# Patient Record
Sex: Male | Born: 1960 | Hispanic: No | Marital: Married | Smoking: Never smoker
Health system: Southern US, Community
[De-identification: ages and names within clinical notes are randomized; demographics above are authoritative.]

## PROBLEM LIST (undated history)

## (undated) HISTORY — PX: FOOT SURGERY: SHX648

## (undated) HISTORY — PX: ANKLE ARTHROPLASTY: SUR68

---

## 2001-04-06 ENCOUNTER — Emergency Department (HOSPITAL_COMMUNITY): Admission: EM | Admit: 2001-04-06 | Discharge: 2001-04-06 | Payer: Self-pay | Admitting: Emergency Medicine

## 2001-04-06 ENCOUNTER — Encounter: Payer: Self-pay | Admitting: Emergency Medicine

## 2002-02-07 ENCOUNTER — Emergency Department (HOSPITAL_COMMUNITY): Admission: EM | Admit: 2002-02-07 | Discharge: 2002-02-07 | Payer: Self-pay | Admitting: Emergency Medicine

## 2005-07-04 ENCOUNTER — Emergency Department (HOSPITAL_COMMUNITY): Admission: EM | Admit: 2005-07-04 | Discharge: 2005-07-04 | Payer: Self-pay | Admitting: Emergency Medicine

## 2005-12-20 ENCOUNTER — Emergency Department (HOSPITAL_COMMUNITY): Admission: EM | Admit: 2005-12-20 | Discharge: 2005-12-20 | Payer: Self-pay | Admitting: Emergency Medicine

## 2005-12-25 ENCOUNTER — Emergency Department (HOSPITAL_COMMUNITY): Admission: EM | Admit: 2005-12-25 | Discharge: 2005-12-25 | Payer: Self-pay | Admitting: Emergency Medicine

## 2005-12-30 ENCOUNTER — Emergency Department (HOSPITAL_COMMUNITY): Admission: EM | Admit: 2005-12-30 | Discharge: 2005-12-30 | Payer: Self-pay | Admitting: Emergency Medicine

## 2006-02-08 ENCOUNTER — Inpatient Hospital Stay (HOSPITAL_COMMUNITY): Admission: EM | Admit: 2006-02-08 | Discharge: 2006-02-11 | Payer: Self-pay | Admitting: Emergency Medicine

## 2006-02-18 ENCOUNTER — Encounter: Payer: Self-pay | Admitting: Vascular Surgery

## 2006-02-18 ENCOUNTER — Emergency Department (HOSPITAL_COMMUNITY): Admission: EM | Admit: 2006-02-18 | Discharge: 2006-02-18 | Payer: Self-pay | Admitting: Emergency Medicine

## 2008-07-19 ENCOUNTER — Encounter: Admission: RE | Admit: 2008-07-19 | Discharge: 2008-07-19 | Payer: Self-pay | Admitting: Chiropractic Medicine

## 2010-11-08 NOTE — Op Note (Signed)
NAMEBELMONT, VALLI        ACCOUNT NO.:  [id number]   MEDICAL RECORD NO.:  [id number]          PATIENT TYPE:  INP   LOCATION:  5019                         FACILITY:  [facility]   PHYSICIAN:  Doralee Albino. Carola Frost, M.D. DATE OF BIRTH:  Nov 01, 1960   DATE OF PROCEDURE:  02/08/2006  DATE OF DISCHARGE:                                 OPERATIVE REPORT   PREOPERATIVE DIAGNOSES:  1. Right subtalar fracture-dislocation involving the navicular and      cuneiforms.  2. Right distal radius fracture.   POSTOPERATIVE DIAGNOSES:  1. Right subtalar fracture-dislocation involving the navicular and      cuneiforms.  2. Right distal radius fracture.   PROCEDURES:  1. Open reduction of subtalar dislocation.  2. ORIF of navicular.  3. Open treatment of a distal radius.   SURGEON:  Dr. Myrene Galas.   ASSISTANT:  None.   ANESTHESIA:  General.   COMPLICATIONS:  None.   SPECIMENS:  None.   ESTIMATED BLOOD LOSS:  150 mL.   DISPOSITION:  PACU.   CONDITION:  Stable.   BRIEF SUMMARY OF INDICATIONS FOR PROCEDURE:  Ruby Dilone is a 50-  year-old male who fell 30 feet off a ladder when some hornets attacked him.  He denies any loss of consciousness, denies tingling, numbness in his  extremities.  X-rays and CT were notable for L3 compression fracture with a  little less than 50% of loss of height, no retropulsion, no significant  malalignment, a comminuted subtalar fracture-dislocation and with fracture  of the navicular, as well as the intermediate and lateral cuneiforms and  cuboid.  there did not appear to be any step-off at the second metatarsal  cuneiform joint, suggestive of full Lisfranc injury.  There did appear to be  impaction of the talar head into the navicular.  We discussed the risks and  benefits of surgery, including the possibility of wound breakdown,  infection, nerve injury, vessel injury, arthritis, stiffness, need for  further surgery including fusion or hardware  removal, as well as  thromboembolic complications.  With regard the wrist, we did also mention  the possibility of ligamentous injury involving the carpals, and whether or  not delayed reconstruction may be indicated.  These films were also reviewed  with hand surgeon, Dr. Dairl Ponder, who recommended proceeding with  internal fixation of the bone, with or without pinning as indicated during  fluoro examination, as well as manipulation during the procedure.  After  full discussion, the patient and his wife wished to proceed.   BRIEF DESCRIPTION OF PROCEDURE:  Mr. Velez was administered  preoperative antibiotics, taken to the operating room where general  anesthesia was induced.  His right upper extremity and right lower extremity  were prepped and draped in the usual sterile fashion.  We began with the  right lower extremity.  Using a 3.5 cm incision medial to the anterior  tibial tendon, we carried dissection carefully down, retracting the  neurovascular bundle medially, and incised the capsule.  There was  significant capsular injury and disruption, and much of this was caught  within the dislocation site.  We were unable to mobilize  and reduce the  fracture initially, and had to debride much of this capsular material.  We  were eventually able to disimpact the talar head and to obtain a reduction.  We then used a pointed tenaculum to compress the fracture site of the  navicular.  We still were unable to completely adequately reduce the  calcaneocuboid joint, and consequently identified the joint on fluoro, and  made a separate lateral incision, retracting the EDB anteriorly and exposing  the joint.  We then were able to manipulate it with the Glorious Peach and remove  small blocking fragments, and to obtain an anatomic reduction under direct  visualization.  This was held with a 1.6 K-wire, which was bent outside the  skin and Jurgan ball placed.  Two dorsal to plantar lag screws  were placed  using 2-7 Synthes screws from the foot modular set in order to obtain  compression across the navicular fracture site.  This resulted in a stable  position for the midfoot and at that point wounds were irrigated and closed  in standard layered fashion, using 2-0 Vicryl and 3-0 nylon.  Sterile gently  compressive dressing was applied and a posterior and stirrup splint with  foot in slight eversion.   Attention was then turned to the right distal radius where a small stab  incision was made over the radial styloid, and the guide pin for the Acutrak  screw placed in the center of the styloid segment.  It was then driven  across under the subchondral surface in order to obtain compression across  the articular surface.  We did do this, but it did seem to translate the  styloid segment somewhat and consequently the screw was redirected slightly  more proximal in the ulnar column of the distal radius, and this resulted in  an improved alignment.  Multiple fluoro images were obtained, demonstrating  acceptable reduction of the articular surface and compression without step-  off, but with a small dorsal segment being visualized as it attached it to  the joint capsule.  The scapholunate appeared to be reduced in a relaxed  position for the wrist, and consequently no additional fixation was placed  between the carpals.  We will, again, watch this closely in the  postoperative period to make sure that no further intervention is needed.  This will most likely consist of clenched views some time after a union of  his distal radius fracture, most likely at the 6-week mark.  This appears to  be a partial injury, which is consistent with the expected pattern as  described by Dr. Mina Marble preoperatively.  Again, sterile gently compressive  dressing was applied and a sugar-tong splint.  The patient was then awakened from anesthesia and transported to the PACU in stable condition.    PROGNOSIS:  Mr. Butler Denmark sustained severe high-energy, fracture-  dislocation to his right foot, and he is at significantly elevated risk of  complications, including midfoot arthritis, stiffness, loss of motion and  possible need for further surgery, including possible fusion.  He also is at  increased risk of thromboembolic complications given the anticipated  difficulty with mobilization for ipsilateral upper and lower extremity  fractures.  He will be on Lovenox initially, and we will see if we can  arrange for qualification to the Lovenox indigent program.      Doralee Albino. Carola Frost, M.D.  Electronically Signed     MHH/MEDQ  D:  02/08/2006  T:  02/08/2006  Job:  [id number]

## 2010-11-08 NOTE — Discharge Summary (Signed)
Zachary Curtis, Zachary Curtis        ACCOUNT NO.:  [id number]   MEDICAL RECORD NO.:  [id number]          PATIENT TYPE:  INP   LOCATION:  5019                         FACILITY:  [facility]   PHYSICIAN:  Doralee Albino. Carola Frost, M.D. DATE OF BIRTH:  [birth year]   DATE OF ADMISSION:  02/08/2006  DATE OF DISCHARGE:  02/11/2006                                 DISCHARGE SUMMARY   ADMITTING DIAGNOSES:  1. L3 compression fracture.  2. Right distal radius fracture.  3. Navicular fracture.  4. Subtalar dislocation on the right foot.   DISCHARGE DIAGNOSES:  1. L3 compression fracture.  2. Right distal radius fracture.  3. Navicular fracture.  4. Subtalar dislocation on the right foot.   BRIEF HISTORY:  Patient is a 50 year old muscular male who fell 30 feet off  the ladder when he was attacked by hornets.  He had his chainsaw in his  hands.  He complained of right wrist, right foot and back pain.  He denied  loss of consciousness, numbness and tingling in upper or lower extremities.  He was brought to the emergency room and found to have an L3 compression  fracture, right subtalar dislocation and navicular cuboid fracture and right  distal radius fracture.  Treatment and options were explained to the patient  and he elected to proceed with surgical intervention.   CONSULTATIONS:  None.   HOSPITAL COURSE:  The patient was admitted through the emergency room with  the above-stated injuries.  Plans were made to take him to the operating  room for surgical intervention.  On February 08, 2006 he underwent an open  reduction internal fixation of subclavian dislocation, ORIF navicular and  open treatment of distal radius fracture.  Post treatment with an LSO for  his L3 compression fracture.  First day postoperatively, patient was doing  well.  Vital signs were stable.  He was neurovascularly intact throughout.  Stable short leg splint on the right lower extremity and stable in the right  wrist, short arm  splint.  LSO was fitted to the patient in the morning and  arrangements were made for discharge planning.  He was placed on Lovenox for  DVT prophylaxis.  He had some acute blood loss anemia postoperatively and it  was stable, not requiring transfusion.  Weaned off PCA and p.o. pain meds.  Okay to sit up in his LSO.  Ready for discharge home after he progressed  with therapy on February 11, 2006.  He remained neurovascularly intact  throughout his hospital stay in bilateral upper and lower extremities.  His  postoperative splints were stable, clean and dry.  Stable in LSO for his  compression fracture.  Ready for discharge home.  Lovenox and Percocet.  He  is given Lovenox teaching.  Follow up with Dr. Carola Frost in 10 to 14 days.  Call  for problems.  Call if medical equipment is needed.  Nonweight bearing right  lower extremity and right wrist.  Weight bearing as tolerated through his  elbow on the right.  Okay to sit up with his LSO in place.   DISCHARGE CONDITION:  Stable and improved.   DISCHARGE LABS:  WBC 5.7, hemoglobin 12.8, hematocrit 36.7, PT 13.2, PTT  29.0, sodium 139, potassium 3.9.  All other indices are within normal  limits, can be obtained per hospital record.      Zachary Curtis.      Doralee Albino. Carola Frost, M.D.  Electronically Signed    SCI/MEDQ  D:  04/08/2006  T:  04/08/2006  Job:  [id number]

## 2014-09-26 ENCOUNTER — Emergency Department (HOSPITAL_COMMUNITY)
Admission: EM | Admit: 2014-09-26 | Discharge: 2014-09-26 | Disposition: A | Discharge status: Home / Self Care | Payer: Self-pay | Attending: Emergency Medicine | Admitting: Emergency Medicine

## 2014-09-26 ENCOUNTER — Encounter (HOSPITAL_COMMUNITY): Payer: Self-pay | Admitting: Emergency Medicine

## 2014-09-26 ENCOUNTER — Emergency Department (HOSPITAL_COMMUNITY): Payer: Self-pay

## 2014-09-26 DIAGNOSIS — M79672 Pain in left foot: Secondary | ICD-10-CM | POA: Insufficient documentation

## 2014-09-26 MED ORDER — NAPROXEN 250 MG PO TABS
500.0000 mg | ORAL_TABLET | Freq: Once | ORAL | Status: AC
  Administered 2014-09-26: 500 mg via ORAL
  Filled 2014-09-26: qty 2

## 2014-09-26 MED ORDER — NAPROXEN 500 MG PO TABS: 500.0000 mg | ORAL_TABLET | Freq: Two times a day (BID) | ORAL | Status: DC

## 2014-09-26 NOTE — Discharge Instructions (Signed)
Take naproxen as prescribed. Follow up with orthopedics.  Musculoskel Management consultantDolor msculoesqueltico (Musculoskeletal Pain) El dolor musculoesqueltico se siente en huesos y msculos. El dolor puede ocurrir en cualquier parte del cuerpo. El profesional que lo asiste podr tratarlo sin Geologist, engineeringconocer la causa del dolor. Lo tratar Time Warneraunque las pruebas de laboratorio (sangre y [place]), las radiografas y otros estudios sean normales. La causa de estos dolores puede ser un virus.  CAUSAS Generalmente no existe una causa definida para este trastorno. Tambin el Citigroupmalestar puede deberse a la Kingstonactividad excesiva. En la actividad excesiva se incluye el hacer ejercicios fsicos muy intensos cuando no se est en buena forma. El dolor de huesos tambin puede deberse a cambios climticos. Los huesos son sensibles a los cambios en la presin atmosfrica. INSTRUCCIONES PARA EL CUIDADO DOMICILIARIO  Para proteger su privacidad, no se entregarn los The Sherwin-Williamsresultados de las pruebas por telfono. Asegrese de conseguirlos. Consulte el modo en que podr obtenerlos si no se lo han informado. Es su responsabilidad contar con los Lubrizol Corporationresultados de las pruebas.  Utilice los medicamentos de venta libre o de prescripcin para Chief Technology Officerel dolor, Environmental health practitionerel malestar o la [place] Springsfiebre, segn se lo indique el profesional que lo asiste. Si le han administrado medicamentos, no conduzca, no opere maquinarias ni Diplomatic Services operational officerherramientas elctricas, y tampoco firme documentos legales durante 24 horas. No beba alcohol. No tome pldoras para dormir ni otros medicamentos que Museum/gallery curatorpuedan interferir en el tratamiento.  Podr seguir con todas las actividades a menos que stas le ocasionen ms Merck & Codolor. Cuando el dolor disminuya, es importante que gradualmente reanude toda la rutina habitual. Retome las actividades comenzando lentamente. Aumente gradualmente la intensidad y la duracin de sus actividades o del ejercicio.  Durante los perodos de dolor intenso, el reposo en cama puede ser beneficioso.  Recustese o sintese en la posicin que le sea ms cmoda.  Coloque hielo sobre la zona afectada.  Ponga hielo en Lucile Shuttersuna bolsa.  Colquese una toalla entre la piel y la bolsa de hielo.  Aplique el hielo durante 10 a 20 minutos 3  4 veces por da.  Si el dolor empeora, o no desaparece puede ser Northeast Utilitiesnecesario repetir las pruebas o Education officer, environmentalrealizar nuevos exmenes. El profesional que lo asiste podr requerir investigar ms profundamente para Veterinary surgeonencontrar la causa posible. SOLICITE ATENCIN MDICA DE INMEDIATO SI:  Siente que el dolor empeora y no se alivia con los medicamentos.  Siente dolor en el pecho asociado a falta de aire, sudoracin, nuseas o vmitos.  El dolor se localiza en el abdomen.  Comienza a sentir nuevos sntomas que parecen ser diferentes o que lo preocupan. ASEGRESE DE QUE:   Comprende las instrucciones para el alta mdica.  Controlar su enfermedad.  Solicitar atencin mdica de inmediato segn las indicaciones. Document Released: 03/19/2005 Document Revised: 09/01/2011 [facility] Patient Information 2015 [place], [place]. This information is not intended to replace advice given to you by your health care provider. Make sure you discuss any questions you have with your health care provider. etal Pain Musculoskeletal pain is muscle and boney aches and pains. These pains can occur in any part of the body. Your caregiver may treat you without knowing the cause of the pain. They may treat you if blood or urine tests, X-rays, and other tests were normal.  CAUSES There is often not a definite cause or reason for these pains. These pains may be caused by a type of germ (virus). The discomfort may also come from overuse. Overuse includes working out too hard when your body is not  fit. Boney aches also come from weather changes. Bone is sensitive to atmospheric pressure changes. HOME CARE INSTRUCTIONS   Ask when your test results will be ready. Make sure you get your test results.  Only  take over-the-counter or prescription medicines for pain, discomfort, or fever as directed by your caregiver. If you were given medications for your condition, do not drive, operate machinery or power tools, or sign legal documents for 24 hours. Do not drink alcohol. Do not take sleeping pills or other medications that may interfere with treatment.  Continue all activities unless the activities cause more pain. When the pain lessens, slowly resume normal activities. Gradually increase the intensity and duration of the activities or exercise.  During periods of severe pain, bed rest may be helpful. Lay or sit in any position that is comfortable.  Putting ice on the injured area.  Put ice in a bag.  Place a towel between your skin and the bag.  Leave the ice on for 15 to 20 minutes, 3 to 4 times a day.  Follow up with your caregiver for continued problems and no reason can be found for the pain. If the pain becomes worse or does not go away, it may be necessary to repeat tests or do additional testing. Your caregiver may need to look further for a possible cause. SEEK IMMEDIATE MEDICAL CARE IF:  You have pain that is getting worse and is not relieved by medications.  You develop chest pain that is associated with shortness or breath, sweating, feeling sick to your stomach (nauseous), or throw up (vomit).  Your pain becomes localized to the abdomen.  You develop any new symptoms that seem different or that concern you. MAKE SURE YOU:   Understand these instructions.  Will watch your condition.  Will get help right away if you are not doing well or get worse. Document Released: 06/09/2005 Document Revised: 09/01/2011 Document Reviewed: 02/11/2013 [facility] Patient Information 2015 [place], [place]. This information is not intended to replace advice given to you by your health care provider. Make sure you discuss any questions you have with your health care provider.

## 2014-09-26 NOTE — ED Provider Notes (Signed)
CSN: [id number]     Arrival date & time 09/26/14  1113 History  This chart was scribed for non-physician practitioner, Celene Skeen, PA-C working with Layla Maw Ward, DO by Greggory Stallion, ED scribe. This patient was seen in room TR08C/TR08C and the patient's care was started at 11:54 AM.   Chief Complaint  Patient presents with  . Foot Pain   The history is provided by the patient. No language interpreter was used.    HPI Comments: Zachary Curtis is a 54 y.o. male who presents to the Emergency Department complaining of worsening right foot pain that started 2 days ago. Pt has had a rod placed in his foot in 2007 due to injury. He denies new injury. Bearing weight worsens pain. Pt has taken ibuprofen with some relief. His wife states he has had intermittent pain since the surgery and states that his foot with intermittently have a blue tint with pain over the past few years.  History reviewed. No pertinent past medical history. History reviewed. No pertinent past surgical history. History reviewed. No pertinent family history. History  Substance Use Topics  . Smoking status: Never Smoker   . Smokeless tobacco: Not on file  . Alcohol Use: No    Review of Systems  Musculoskeletal: Positive for arthralgias.  All other systems reviewed and are negative.  Allergies  Review of patient's allergies indicates no known allergies.  Home Medications   Prior to Admission medications   Medication Sig Start Date End Date Taking? Authorizing Provider  naproxen (NAPROSYN) 500 MG tablet Take 1 tablet (500 mg total) by mouth 2 (two) times daily. 09/26/14   Shadie Sweatman M Janyla Biscoe, PA-C   BP 147/87 mmHg  Pulse 103  Temp(Src) 99.2 F (37.3 C) (Oral)  Resp 18  Ht  (1.778 m)  Wt 235 lb (106.595 kg)  BMI 33.72 kg/m2  SpO2 96%   Physical Exam  Constitutional: He is oriented to person, place, and time. He appears well-developed and well-nourished. No distress.  HENT:  Head: Normocephalic and  atraumatic.  Eyes: Conjunctivae and EOM are normal.  Neck: Normal range of motion. Neck supple.  Cardiovascular: Normal rate, regular rhythm and normal heart sounds.   Pulses:      Dorsalis pedis pulses are 2+ on the right side, and 2+ on the left side.       Posterior tibial pulses are 2+ on the right side, and 2+ on the left side.  Pulmonary/Chest: Effort normal and breath sounds normal.  Musculoskeletal:  R foot with 3 cm surgical scar over tarsal area. Mild swelling over tarsal bones into MTP joints. No erythema or warmth. Able to wiggle toes.  Neurological: He is alert and oriented to person, place, and time.  Skin: Skin is warm and dry.  Psychiatric: He has a normal mood and affect. His behavior is normal.  Nursing note and vitals reviewed.   ED Course  Procedures (including critical care time)  DIAGNOSTIC STUDIES: Oxygen Saturation is 96% on RA, normal by my interpretation.    COORDINATION OF CARE: 11:55 AM-Discussed treatment plan which includes xray with pt at bedside and pt agreed to plan.   Labs Review Labs Reviewed - No data to display  Imaging Review Dg Foot Complete Right  09/26/2014   CLINICAL DATA:  Pain swelling.  Fall.  EXAM: RIGHT FOOT COMPLETE - 3+ VIEW  COMPARISON:  None.  FINDINGS: Diffuse degenerative change present. Surgical screws are noted in the navicular. Posttraumatic deformity of the navicula noted.  No evidence of fracture or dislocation.  IMPRESSION: 1. Surgical screws noted in the navicular. Posttraumatic deformity noted of the navicula. 2. Diffuse degenerative change.  No acute abnormality   Electronically Signed   By: Maisie Fushomas  Register   On: 09/26/2014 13:03     EKG Interpretation None      MDM   Final diagnoses:  Left foot pain   Neurovascularly intact. No erythema or warmth concerning for infection. X-ray showing surgical screws, posttraumatic deformity, degenerative changes, no acute finding. He has not seen an orthopedist in 9 years.  Advised patient to follow-up with orthopedics. Naproxen for pain. Rest, ice and elevation. Stable for discharge. Return precautions given. Patient states understanding of treatment care plan and is agreeable.  I personally performed the services described in this documentation, which was scribed in my presence. The recorded information has been reviewed and is accurate.  Kathrynn SpeedRobyn M Sutter Ahlgren, PA-C 09/26/14 1319  Layla MawKristen N Ward, DO 09/26/14 1451

## 2014-09-26 NOTE — ED Notes (Signed)
Pt sts right foot pain and possible issue with rod after having sx several years ago; pt unable to walk on foot due to pain at present

## 2016-01-01 ENCOUNTER — Ambulatory Visit (HOSPITAL_COMMUNITY)
Admission: EM | Admit: 2016-01-01 | Discharge: 2016-01-01 | Disposition: A | Discharge status: Home / Self Care | Payer: Self-pay | Attending: Family Medicine | Admitting: Family Medicine

## 2016-01-01 ENCOUNTER — Ambulatory Visit (INDEPENDENT_AMBULATORY_CARE_PROVIDER_SITE_OTHER): Payer: Self-pay

## 2016-01-01 ENCOUNTER — Encounter (HOSPITAL_COMMUNITY): Payer: Self-pay | Admitting: Emergency Medicine

## 2016-01-01 DIAGNOSIS — M10072 Idiopathic gout, left ankle and foot: Secondary | ICD-10-CM | POA: Insufficient documentation

## 2016-01-01 LAB — POCT I-STAT, CHEM 8
BUN: 17 mg/dL (ref 6–20)
CALCIUM ION: 1.15 mmol/L (ref 1.13–1.30)
Chloride: 101 mmol/L (ref 101–111)
Creatinine, Ser: 0.9 mg/dL (ref 0.61–1.24)
GLUCOSE: 143 mg/dL — AB (ref 65–99)
HEMATOCRIT: 43 % (ref 39.0–52.0)
HEMOGLOBIN: 14.6 g/dL (ref 13.0–17.0)
Potassium: 4 mmol/L (ref 3.5–5.1)
Sodium: 139 mmol/L (ref 135–145)
TCO2: 27 mmol/L (ref 0–100)

## 2016-01-01 LAB — URIC ACID: URIC ACID, SERUM: 7.9 mg/dL — AB (ref 4.4–7.6)

## 2016-01-01 MED ORDER — NAPROXEN 500 MG PO TABS: 500.0000 mg | ORAL_TABLET | Freq: Two times a day (BID) | ORAL | Status: DC

## 2016-01-01 MED ORDER — COLCHICINE 0.6 MG PO TABS: 0.6000 mg | ORAL_TABLET | Freq: Two times a day (BID) | ORAL | Status: AC

## 2016-01-01 NOTE — ED Provider Notes (Signed)
CSN: [id number]     Arrival date & time 01/01/16  1346 History   First MD Initiated Contact with Patient 01/01/16 1503     Chief Complaint  Patient presents with  . Foot Pain   (Consider location/radiation/quality/duration/timing/severity/associated sxs/prior Treatment) Patient is a 55 y.o. male presenting with lower extremity pain. The history is provided by the patient and the spouse. A language interpreter was used (spouse).  Foot Pain This is a new problem. The current episode started more than 2 days ago. The problem has been gradually worsening. Pertinent negatives include no abdominal pain. The symptoms are aggravated by walking.    History reviewed. No pertinent past medical history. Past Surgical History  Procedure Laterality Date  . Foot surgery Right    No family history on file. Social History  Substance Use Topics  . Smoking status: Never Smoker   . Smokeless tobacco: None  . Alcohol Use: No    Review of Systems  Constitutional: Negative.   Gastrointestinal: Negative for abdominal pain.  Musculoskeletal: Positive for myalgias, joint swelling and gait problem.  All other systems reviewed and are negative.   Allergies  Review of patient's allergies indicates no known allergies.  Home Medications   Prior to Admission medications   Medication Sig Start Date End Date Taking? Authorizing Provider  ibuprofen (ADVIL,MOTRIN) 100 MG tablet Take 100 mg by mouth every 6 (six) hours as needed for fever.   Yes Historical Provider, MD  colchicine 0.6 MG tablet Take 1 tablet (0.6 mg total) by mouth 2 (two) times daily. 01/01/16   Linna HoffJames D Kindl, MD  naproxen (NAPROSYN) 500 MG tablet Take 1 tablet (500 mg total) by mouth 2 (two) times daily with a meal. 01/01/16   Linna HoffJames D Kindl, MD   Meds Ordered and Administered this Visit  Medications - No data to display  BP 124/74 mmHg  Pulse 80  Temp(Src) 99 F (37.2 C) (Oral)  Resp 24  SpO2 95% No data found.   Physical Exam   Constitutional: He is oriented to person, place, and time. He appears well-developed and well-nourished.  Musculoskeletal: He exhibits tenderness.       Left ankle: He exhibits decreased range of motion and swelling. He exhibits normal pulse. Tenderness. Lateral malleolus and medial malleolus tenderness found. Achilles tendon normal.  Neurological: He is alert and oriented to person, place, and time.  Skin: Skin is warm.  Nursing note and vitals reviewed.   ED Course  Procedures (including critical care time)  Labs Review Labs Reviewed  POCT I-STAT, CHEM 8 - Abnormal; Notable for the following:    Glucose, Bld 143 (*)    All other components within normal limits  URIC ACID    Imaging Review Dg Ankle Complete Left  01/01/2016  CLINICAL DATA:  Pain and swelling of the left ankle for over a week. No known injury. EXAM: LEFT ANKLE COMPLETE - 3+ VIEW COMPARISON:  None. FINDINGS: No acute fracture or dislocation. Ankle mortise is intact. Small ankle joint effusion. Small ossific fragment along the anterior tibiotalar joint which may reflect a small loose body. Generalized soft tissue swelling around the left ankle. IMPRESSION: 1.  No acute osseous injury of the left ankle. 2. Small ankle joint effusion with a possible small loose body anterior to the tibiotalar joint. 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

## 2016-01-01 NOTE — ED Notes (Signed)
Left foot pain and swelling, redness.  No known injury.  Patient has been soaking foot in epsom salts and taking ibuprofen. Reports pain is radiating up left leg to hip .  Patient has had foot pain for 6 days

## 2016-01-01 NOTE — Discharge Instructions (Signed)
Take medicine as prescribed, we will call with blood results.

## 2016-01-11 ENCOUNTER — Encounter (HOSPITAL_COMMUNITY): Payer: Self-pay | Admitting: Emergency Medicine

## 2016-01-11 ENCOUNTER — Emergency Department (HOSPITAL_COMMUNITY)
Admission: EM | Admit: 2016-01-11 | Discharge: 2016-01-11 | Disposition: A | Discharge status: Home / Self Care | Payer: Self-pay | Attending: Emergency Medicine | Admitting: Emergency Medicine

## 2016-01-11 DIAGNOSIS — K644 Residual hemorrhoidal skin tags: Secondary | ICD-10-CM | POA: Insufficient documentation

## 2016-01-11 DIAGNOSIS — Z791 Long term (current) use of non-steroidal anti-inflammatories (NSAID): Secondary | ICD-10-CM | POA: Insufficient documentation

## 2016-01-11 DIAGNOSIS — Z79899 Other long term (current) drug therapy: Secondary | ICD-10-CM | POA: Insufficient documentation

## 2016-01-11 LAB — COMPREHENSIVE METABOLIC PANEL
ALBUMIN: 4.4 g/dL (ref 3.5–5.0)
ALT: 31 U/L (ref 17–63)
AST: 20 U/L (ref 15–41)
Alkaline Phosphatase: 94 U/L (ref 38–126)
Anion gap: 7 (ref 5–15)
BUN: 19 mg/dL (ref 6–20)
CHLORIDE: 105 mmol/L (ref 101–111)
CO2: 29 mmol/L (ref 22–32)
CREATININE: 1.04 mg/dL (ref 0.61–1.24)
Calcium: 9.6 mg/dL (ref 8.9–10.3)
GFR calc Af Amer: >60 mL/min (ref >60)
GFR calc non Af Amer: >60 mL/min (ref >60)
Glucose, Bld: 121 mg/dL — ABNORMAL HIGH (ref 65–99)
Potassium: 4.4 mmol/L (ref 3.5–5.1)
SODIUM: 141 mmol/L (ref 135–145)
Total Bilirubin: 0.6 mg/dL (ref 0.3–1.2)
Total Protein: 8.1 g/dL (ref 6.5–8.1)

## 2016-01-11 LAB — CBC
HCT: 41.1 % (ref 39.0–52.0)
Hemoglobin: 13.5 g/dL (ref 13.0–17.0)
MCH: 29.7 pg (ref 26.0–34.0)
MCHC: 32.8 g/dL (ref 30.0–36.0)
MCV: 90.5 fL (ref 78.0–100.0)
PLATELETS: 429 10*3/uL — AB (ref 150–400)
RBC: 4.54 MIL/uL (ref 4.22–5.81)
RDW: 13.3 % (ref 11.5–15.5)
WBC: 7.3 10*3/uL (ref 4.0–10.5)

## 2016-01-11 LAB — TYPE AND SCREEN
ABO/RH(D): O POS
Antibody Screen: NEGATIVE

## 2016-01-11 LAB — ABO/RH: ABO/RH(D): O POS

## 2016-01-11 MED ORDER — POLYETHYLENE GLYCOL 3350 17 G PO PACK: 17.0000 g | PACK | Freq: Every day | ORAL | Status: AC

## 2016-01-11 MED ORDER — HYDROCORTISONE ACETATE 25 MG RE SUPP: 25.0000 mg | Freq: Two times a day (BID) | RECTAL | Status: DC

## 2016-01-11 NOTE — Discharge Instructions (Signed)
Your rectal bleeding is likely from hemorrhoid due to being constipated.  Take Tramadol may cause constipation.  Please avoid taking tramadol, use stool softener and Anusol for care.  Follow up with a gastroenterologist (stomach doctor) to be evaluate further for your rectal bleeding if it's persistent.  You may need a colonoscopy.    Hemorroides  (Hemorrhoids)  Las hemorroides son venas inflamadas (hinchadas) alrededor del recto o del ano. Las hemorroides causan dolor, picazn, sangrado o irritacin.  CUIDADOS EN EL HOGAR   Consuma alimentos con fibra, como cereales integrales, legumbres, frutos secos, frutas y verduras. Pregntele a su mdico acerca de tomar productos con fibra aadida en ellos (suplementos defibra).  Beba gran cantidad de lquido para mantener el pis (orina) de tono claro o amarillo plido.  Haga ejercicio a menudo.  Vaya al bao cuando sienta la necesidad de ir de cuerpo. No espere.  Evite hacer fuerza al ir de cuerpo Contractor(mover el intestino).Zachary Curtis.  Mantenga la zona anal limpia y seca. Use papel higinico mojado o toallitas de papel humedecidas.  Puede utilizar o Contractoraplicar segn las indicaciones las cremas recetadas y los medicamentos que se aplican en el ano (supositorio anal )  Slo tome los medicamentos que le indique el mdico.  Tome un bao de agua tibia (bao de asiento) durante 15 a 20 minutos para Engineer, materialsaliviar el dolor. Repita 3  4 veces por da.  Coloque una bolsa de hielo sobre la zona si le duele o est inflamada. Use compresas de hielo Marriottentre los baos de agua tibia.  Ponga el hielo en una bolsa plstica.  Colquese una toalla entre la piel y la bolsa de hielo.  Deje el hielo en el lugar durante 15 a 20 minutos, 3 a 4 veces por da.  No utilice una almohada en forma de aro ni se siente en el inodoro durante perodos prolongados. SOLICITE AYUDA DE INMEDIATO SI:   Aumenta el dolor y no puede controlarlo con la medicacin o con Pharmacist, communityun tratamiento.  Tiene una  hemorragia que no se detiene.  Tiene dificultado o no puede ir de cuerpo Contractor(mover el intestino).  Siente dolor o tiene inflamacin fuera de la zona de las hemorroides. ASEGRESE DE QUE:   Comprende estas instrucciones.  Controlar su enfermedad.  Solicitar ayuda de inmediato si no mejora o si empeora.   Esta informacin no tiene Theme park managercomo fin reemplazar el consejo del mdico. Asegrese de hacerle al mdico cualquier pregunta que tenga.   Document Released: 10/04/2012 Elsevier Interactive Patient Education [employer]2016 Elsevier Inc.

## 2016-01-11 NOTE — ED Provider Notes (Signed)
CSN: [id number]     Arrival date & time 01/11/16  1429 History   First MD Initiated Contact with Patient 01/11/16 1645     Chief Complaint  Patient presents with  . Blood In Stools     (Consider location/radiation/quality/duration/timing/severity/associated sxs/prior Treatment) HPI   55 year old Hispanic male presenting for evaluation of rectal bleeding. Wife, who is at bedside serves as interpreter.  Patient developed left foot pain 2 weeks ago. He was seen at the urgent care 10 days ago for this complaint. He was diagnosed with gout and was prescribed pain medication, naproxen. Since patient is unable to afford his medication, his wife has been giving him her tramadol for pain. He has been taking the medication with moderate improvement however he has been feeling constipated for the past several days. Patient states he has to strain each time he half a bowel movement for the past 3 days he has noticed bright red blood mixed with stools. Today blood drips down into the toilet bowl which concerns him. He does complain of tenderness to his rectum with some mild itching. Has not had this problem before. He denies any fever chills lightheadedness dizziness chest pain shortness of breath abdominal pain back pain dysuria or hematuria or recent trauma. He has not had a colonoscopy. He denies any abnormal weight changes or night sweats and no history of active cancer. Denies nausea vomiting.  History reviewed. No pertinent past medical history. Past Surgical History  Procedure Laterality Date  . Foot surgery Right    No family history on file. Social History  Substance Use Topics  . Smoking status: Never Smoker   . Smokeless tobacco: None  . Alcohol Use: No    Review of Systems  All other systems reviewed and are negative.     Allergies  Review of patient's allergies indicates no known allergies.  Home Medications   Prior to Admission medications   Medication Sig Start Date End Date  Taking? Authorizing Provider  colchicine 0.6 MG tablet Take 1 tablet (0.6 mg total) by mouth 2 (two) times daily. 01/01/16  Yes Linna Hoff, MD  OVER THE COUNTER MEDICATION Take 15 mLs by mouth daily. *Hosania Supplement*   Yes Historical Provider, MD  traMADol (ULTRAM) 50 MG tablet Take 50 mg by mouth 2 (two) times daily as needed for moderate pain or severe pain.   Yes Historical Provider, MD  naproxen (NAPROSYN) 500 MG tablet Take 1 tablet (500 mg total) by mouth 2 (two) times daily with a meal. Patient not taking: Reported on 01/11/2016 01/01/16   Linna Hoff, MD   BP 129/78 mmHg  Pulse 67  Temp(Src) 98.3 F (36.8 C) (Oral)  Resp 16  Ht  (1.778 m)  Wt 83.915 kg  BMI 26.54 kg/m2  SpO2 98% Physical Exam  Constitutional: He appears well-developed and well-nourished. No distress.  HENT:  Head: Atraumatic.  Eyes: Conjunctivae are normal.  Neck: Neck supple.  Cardiovascular: Normal rate and regular rhythm.   Pulmonary/Chest: Effort normal and breath sounds normal.  Abdominal: Soft. There is no tenderness.  Genitourinary:  Chaperone present during exam. Evidence of external hemorrhoid with associated anal fissure at the 12 and 3:00 position. Small amount of blood noted on glove. No obvious mass, no stool impaction. Normal rectal tone.  Neurological: He is alert.  Skin: No rash noted.  Psychiatric: He has a normal mood and affect.  Nursing note and vitals reviewed.   ED Course  Procedures (including critical care  time) Labs Review Labs Reviewed  COMPREHENSIVE METABOLIC PANEL - Abnormal; Notable for the following:    Glucose, Bld 121 (*)    All other components within normal limits  CBC - Abnormal; Notable for the following:    Platelets 429 (*)    All other components within normal limits  POC OCCULT BLOOD, ED  POC OCCULT BLOOD, ED  TYPE AND SCREEN  ABO/RH    Imaging Review No results found. I have personally reviewed and evaluated these images and lab results as  part of my medical decision-making.   EKG Interpretation None      MDM   Final diagnoses:  Bleeding external hemorrhoids    BP 129/78 mmHg  Pulse 67  Temp(Src) 98.3 F (36.8 C) (Oral)  Resp 16  Ht 5\' 10"  (1.778 m)  Wt 83.915 kg  BMI 26.54 kg/m2  SpO2 98%   5:21 PM Patient here with bright red blood per rectum. He has been having constipation after taking tramadol for foot pain. He does have evidence of bleeding external hemorrhoid on exam which I suspect is the source of bleeding. Patient does not have any significant abdominal pain to suggest diverticulitis or other acute emergent abdominal pathology. His labs are reassuring. Plan to discharge with stool softener, Preparation H, and encouraged patient to follow with the GI for further management of his bleeding. He'll benefit from a colonoscopy since he's 55 but have not have one yet.     Fayrene HelperBowie Nyron Mozer, PA-C 01/11/16 1816  Alvira MondayErin Schlossman, MD 01/12/16 [phone]

## 2016-01-11 NOTE — ED Notes (Signed)
Per pt, states bright red blood in stool for two-occurs with BM-states BM was painful-states possible constipation from pain meds

## 2018-11-23 ENCOUNTER — Encounter (HOSPITAL_COMMUNITY): Payer: Self-pay | Admitting: Emergency Medicine

## 2018-11-23 ENCOUNTER — Emergency Department (HOSPITAL_COMMUNITY)
Admission: EM | Admit: 2018-11-23 | Discharge: 2018-11-23 | Disposition: A | Discharge status: Home / Self Care | Payer: No Typology Code available for payment source | Attending: Emergency Medicine | Admitting: Emergency Medicine

## 2018-11-23 ENCOUNTER — Emergency Department (HOSPITAL_COMMUNITY): Payer: No Typology Code available for payment source

## 2018-11-23 ENCOUNTER — Other Ambulatory Visit: Payer: Self-pay

## 2018-11-23 DIAGNOSIS — Y998 Other external cause status: Secondary | ICD-10-CM | POA: Insufficient documentation

## 2018-11-23 DIAGNOSIS — R52 Pain, unspecified: Secondary | ICD-10-CM

## 2018-11-23 DIAGNOSIS — M546 Pain in thoracic spine: Secondary | ICD-10-CM | POA: Insufficient documentation

## 2018-11-23 DIAGNOSIS — Y929 Unspecified place or not applicable: Secondary | ICD-10-CM | POA: Diagnosis not present

## 2018-11-23 DIAGNOSIS — R0781 Pleurodynia: Secondary | ICD-10-CM | POA: Diagnosis not present

## 2018-11-23 DIAGNOSIS — Y9389 Activity, other specified: Secondary | ICD-10-CM | POA: Diagnosis not present

## 2018-11-23 DIAGNOSIS — Z23 Encounter for immunization: Secondary | ICD-10-CM | POA: Insufficient documentation

## 2018-11-23 DIAGNOSIS — M25531 Pain in right wrist: Secondary | ICD-10-CM | POA: Diagnosis not present

## 2018-11-23 MED ORDER — TETANUS-DIPHTH-ACELL PERTUSSIS 5-2.5-18.5 LF-MCG/0.5 IM SUSP
0.5000 mL | Freq: Once | INTRAMUSCULAR | Status: AC
  Administered 2018-11-23: 0.5 mL via INTRAMUSCULAR
  Filled 2018-11-23: qty 0.5

## 2018-11-23 MED ORDER — TRAMADOL HCL 50 MG PO TABS: 50.0000 mg | ORAL_TABLET | Freq: Four times a day (QID) | ORAL | 0 refills | Status: DC | PRN

## 2018-11-23 MED ORDER — IBUPROFEN 400 MG PO TABS: 400.0000 mg | ORAL_TABLET | Freq: Three times a day (TID) | ORAL | 0 refills | Status: AC
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
## 2022-03-21 ENCOUNTER — Emergency Department (HOSPITAL_COMMUNITY)
Admission: EM | Admit: 2022-03-21 | Discharge: 2022-03-22 | Disposition: A | Discharge status: Home / Self Care | Payer: Self-pay | Attending: Emergency Medicine | Admitting: Emergency Medicine

## 2022-03-21 ENCOUNTER — Encounter (HOSPITAL_COMMUNITY): Payer: Self-pay | Admitting: Emergency Medicine

## 2022-03-21 ENCOUNTER — Emergency Department (HOSPITAL_COMMUNITY): Payer: Self-pay

## 2022-03-21 ENCOUNTER — Other Ambulatory Visit: Payer: Self-pay

## 2022-03-21 DIAGNOSIS — M549 Dorsalgia, unspecified: Secondary | ICD-10-CM | POA: Insufficient documentation

## 2022-03-21 DIAGNOSIS — G510 Bell's palsy: Secondary | ICD-10-CM | POA: Insufficient documentation

## 2022-03-21 DIAGNOSIS — R030 Elevated blood-pressure reading, without diagnosis of hypertension: Secondary | ICD-10-CM | POA: Insufficient documentation

## 2022-03-21 DIAGNOSIS — E871 Hypo-osmolality and hyponatremia: Secondary | ICD-10-CM | POA: Insufficient documentation

## 2022-03-21 DIAGNOSIS — Z20822 Contact with and (suspected) exposure to covid-19: Secondary | ICD-10-CM | POA: Insufficient documentation

## 2022-03-21 DIAGNOSIS — R079 Chest pain, unspecified: Secondary | ICD-10-CM | POA: Insufficient documentation

## 2022-03-21 DIAGNOSIS — R509 Fever, unspecified: Secondary | ICD-10-CM | POA: Insufficient documentation

## 2022-03-21 DIAGNOSIS — R17 Unspecified jaundice: Secondary | ICD-10-CM

## 2022-03-21 LAB — COMPREHENSIVE METABOLIC PANEL
ALT: 23 U/L (ref 0–44)
AST: 17 U/L (ref 15–41)
Albumin: 4.2 g/dL (ref 3.5–5.0)
Alkaline Phosphatase: 67 U/L (ref 38–126)
Anion gap: 8 (ref 5–15)
BUN: 13 mg/dL (ref 8–23)
CO2: 24 mmol/L (ref 22–32)
Calcium: 9.2 mg/dL (ref 8.9–10.3)
Chloride: 102 mmol/L (ref 98–111)
Creatinine, Ser: 0.79 mg/dL (ref 0.61–1.24)
GFR, Estimated: >60 mL/min (ref >60)
Glucose, Bld: 109 mg/dL — ABNORMAL HIGH (ref 70–99)
Potassium: 3.9 mmol/L (ref 3.5–5.1)
Sodium: 134 mmol/L — ABNORMAL LOW (ref 135–145)
Total Bilirubin: 2.2 mg/dL — ABNORMAL HIGH (ref 0.3–1.2)
Total Protein: 8 g/dL (ref 6.5–8.1)

## 2022-03-21 LAB — DIFFERENTIAL
Abs Immature Granulocytes: 0.02 10*3/uL (ref 0.00–0.07)
Basophils Absolute: 0 10*3/uL (ref 0.0–0.1)
Basophils Relative: 0 %
Eosinophils Absolute: 0.1 10*3/uL (ref 0.0–0.5)
Eosinophils Relative: 1 %
Immature Granulocytes: 0 %
Lymphocytes Relative: 18 %
Lymphs Abs: 1.8 10*3/uL (ref 0.7–4.0)
Monocytes Absolute: 0.8 10*3/uL (ref 0.1–1.0)
Monocytes Relative: 8 %
Neutro Abs: 7.5 10*3/uL (ref 1.7–7.7)
Neutrophils Relative %: 73 %

## 2022-03-21 LAB — CBC
HCT: 42.1 % (ref 39.0–52.0)
Hemoglobin: 14.2 g/dL (ref 13.0–17.0)
MCH: 30.3 pg (ref 26.0–34.0)
MCHC: 33.7 g/dL (ref 30.0–36.0)
MCV: 90 fL (ref 80.0–100.0)
Platelets: 235 10*3/uL (ref 150–400)
RBC: 4.68 MIL/uL (ref 4.22–5.81)
RDW: 13.2 % (ref 11.5–15.5)
WBC: 10.1 10*3/uL (ref 4.0–10.5)
nRBC: 0 % (ref 0.0–0.2)

## 2022-03-21 LAB — TROPONIN I (HIGH SENSITIVITY)
Troponin I (High Sensitivity): 4 ng/L (ref <18)
Troponin I (High Sensitivity): 5 ng/L (ref <18)

## 2022-03-21 LAB — ETHANOL: Alcohol, Ethyl (B): <10 mg/dL (ref <10)

## 2022-03-21 LAB — LACTIC ACID, PLASMA
Lactic Acid, Venous: 0.7 mmol/L (ref 0.5–1.9)
Lactic Acid, Venous: 0.8 mmol/L (ref 0.5–1.9)

## 2022-03-21 LAB — APTT: aPTT: 30 seconds (ref 24–36)

## 2022-03-21 LAB — PROTIME-INR
INR: 1 (ref 0.8–1.2)
Prothrombin Time: 12.6 seconds (ref 11.4–15.2)

## 2022-03-21 MED ORDER — VALACYCLOVIR HCL 1 G PO TABS: 1000.0000 mg | ORAL_TABLET | Freq: Three times a day (TID) | ORAL | 0 refills | Status: AC

## 2022-03-21 MED ORDER — PREDNISONE 20 MG PO TABS
60.0000 mg | ORAL_TABLET | Freq: Once | ORAL | Status: AC
  Administered 2022-03-22: 60 mg via ORAL
  Filled 2022-03-21: qty 3

## 2022-03-21 MED ORDER — PREDNISONE 50 MG PO TABS: 50.0000 mg | ORAL_TABLET | Freq: Every day | ORAL | 0 refills | Status: AC

## 2022-03-21 NOTE — Discharge Instructions (Addendum)
Take acetaminophen and/or ibuprofen as needed for pain or fever.  Return if you have any new or concerning symptoms.  Please check your blood pressure several times over the next week. It was high tonight. If it continues to be high, you may need to be on some medication to control it.

## 2022-03-21 NOTE — ED Provider Notes (Signed)
[facility] DEPT Provider Note   CSN: [id number] Arrival date & time: 03/21/22  1703     History  Chief Complaint  Patient presents with   Facial Droop    Zachary Curtis is a 61 y.o. male.  The history is provided by the patient.  He complains of left-sided facial droop for the last 2 days.  He is also unable to close his left eye.  He denies any headache but there is some mild pain in his left ear.  He is also complaining of some soreness in the left upper back which is somewhat worse if he takes a deep breath or twists or turns.  He denies any trauma.  He is noted to have a low-grade fever here, but denies fevers at home.   Home Medications Prior to Admission medications   Medication Sig Start Date End Date Taking? Authorizing Provider  predniSONE (DELTASONE) 50 MG tablet Take 1 tablet (50 mg total) by mouth daily. 3/47/42  Yes Delora Fuel, MD  valACYclovir (VALTREX) 1000 MG tablet Take 1 tablet (1,000 mg total) by mouth 3 (three) times daily. 5/95/63  Yes Delora Fuel, MD  colchicine 0.6 MG tablet Take 1 tablet (0.6 mg total) by mouth 2 (two) times daily. 01/01/16   Billy Fischer, MD  OVER THE COUNTER MEDICATION Take 15 mLs by mouth daily. *Hosania Supplement*    [provider]  polyethylene glycol (MIRALAX / GLYCOLAX) packet Take 17 g by mouth daily. 01/11/16   Domenic Moras, PA-C      Allergies    Patient has no known allergies.    Review of Systems   Review of Systems  All other systems reviewed and are negative.   Physical Exam Updated Vital Signs BP (!) 156/103   Pulse 78   Temp 97.9 F (36.6 C) (Oral)   Resp 18   SpO2 97%  Physical Exam Vitals and nursing note reviewed.   61 year old male, resting comfortably and in no acute distress. Vital signs are significant for initial elevated temperature and elevated blood pressure. Oxygen saturation is 97%, which is normal. Head is normocephalic and atraumatic. PERRLA,  EOMI. Oropharynx is clear. Neck is nontender and supple without adenopathy or JVD. Back is nontender and there is no CVA tenderness. Lungs are clear without rales, wheezes, or rhonchi. Chest is nontender. Heart has regular rate and rhythm without murmur. Abdomen is soft, flat, nontender without masses or hepatosplenomegaly and peristalsis is normoactive. Extremities have no cyanosis or edema, full range of motion is present. Skin is warm and dry without rash. Neurologic: Mental status is normal, dense left peripheral 7th nerve palsy, remainder of cranial nerves are intact.  Strength is 5/5 in all 4 extremities.  Station and gait are normal.  ED Results / Procedures / Treatments   Labs (all labs ordered are listed, but only abnormal results are displayed) Labs Reviewed  COMPREHENSIVE METABOLIC PANEL - Abnormal; Notable for the following components:      Result Value   Sodium 134 (*)    Glucose, Bld 109 (*)    Total Bilirubin 2.2 (*)    All other components within normal limits  SARS CORONAVIRUS 2 BY RT PCR  PROTIME-INR  APTT  CBC  DIFFERENTIAL  ETHANOL  LACTIC ACID, PLASMA  LACTIC ACID, PLASMA  URINALYSIS, ROUTINE W REFLEX MICROSCOPIC  TROPONIN I (HIGH SENSITIVITY)  TROPONIN I (HIGH SENSITIVITY)    EKG ED ECG REPORT   Date: 03/21/2022  Rate: 95  Rhythm: normal sinus rhythm  QRS Axis: normal  Intervals: normal  ST/T Wave abnormalities: normal  Conduction Disutrbances:none  Narrative Interpretation: Normal ECG.  No prior ECGs available for comparison.  Old EKG Reviewed: none available  I have personally reviewed the EKG tracing and agree with the computerized printout as noted.   Radiology CT HEAD WO CONTRAST  Result Date: 03/21/2022 CLINICAL DATA:  Left-sided facial droop x2 days. EXAM: CT HEAD WITHOUT CONTRAST TECHNIQUE: Contiguous axial images were obtained from the base of the skull through the vertex without intravenous contrast. RADIATION DOSE REDUCTION: This  exam was performed according to the departmental dose-optimization program which includes automated exposure control, adjustment of the mA and/or kV according to patient size and/or use of iterative reconstruction technique. COMPARISON:  December 20, 2005 FINDINGS: Brain: There is mild cerebral atrophy with widening of the extra-axial spaces and ventricular dilatation. There are areas of decreased attenuation within the white matter tracts of the supratentorial brain, consistent with microvascular disease changes. Vascular: No hyperdense vessel or unexpected calcification. Skull: Chronic, bilateral nondisplaced nasal bone fractures are seen. Sinuses/Orbits: No acute finding. Other: None. IMPRESSION: 1. No acute intracranial abnormality. 2. Generalized cerebral atrophy and microvascular disease changes of the supratentorial brain. 3. Chronic, bilateral nondisplaced nasal bone fractures. Electronically Signed   By: Aram Candela M.D.   On: 03/21/2022 18:13    Procedures Procedures    Medications Ordered in ED Medications  predniSONE (DELTASONE) tablet 60 mg (has no administration in time range)    ED Course/ Medical Decision Making/ A&P                           Medical Decision Making Risk Prescription drug management.   Left-sided Bell's palsy.  No evidence of stroke.  Low-grade fever, possible viral syndrome.  Left upper back pain which seems most likely to be musculoskeletal in origin, low index of suspicion for infectious etiology.  I have reviewed and interpreted his electrocardiogram, and my interpretation is normal ECG with no prior ECG available for comparison.  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  CT of head shows evidence of old nasal fracture but no acute  process.  Chest x-ray shows no evidence of pneumonia.  Have independently viewed the images, and agree with the radiologist's interpretation.  I am giving him prescriptions for a short course of prednisone as well as a 1 week course of valacyclovir.  Patient is advised to use artificial tears, tape his left eye closed when going to sleep.  Blood pressure is elevated, recommended that he recheck it as an outpatient.   Final Clinical Impression(s) / ED Diagnoses Final diagnoses:  Bell's palsy  Nonspecific chest pain  Fever in adult  Elevated blood-pressure reading without diagnosis of hypertension  Hyponatremia  Serum total bilirubin elevated    Rx / DC Orders ED Discharge Orders          Ordered    predniSONE (DELTASONE) 50 MG tablet  Daily        03/21/22 2349    valACYclovir (VALTREX) 1000 MG tablet  3 times daily        03/21/22 2349              Dione Booze, MD 03/22/22 [phone]

## 2022-03-21 NOTE — ED Triage Notes (Signed)
Pt presents with back pain x 2 days.  Pt also has left sided facial droop and inability to close his left eye for 2 days as well.  EDPA at bedside for eval.

## 2022-03-21 NOTE — ED Provider Triage Note (Signed)
Emergency Medicine Provider Triage Evaluation Note  Zachary Curtis , a 61 y.o. male  was evaluated in triage.  Pt complains of left facial droop for two days. He also complains of left upper back pain worse when breathing as well as fever.   He reports no numbness, weakness, speech changes, chest pain, shortness of breath, abdominal pain, flank pain, dysuria, hematuria, cough.  Review of Systems  Positive:  Negative:   Physical Exam  BP (!) 163/113 (BP Location: Left Arm)   Pulse 95   Temp (!) 100.8 F (38.2 C) (Oral)   Resp 18   SpO2 98%  Gen:   Awake, no distress   Resp:  Normal effort  MSK:   Moves extremities without difficulty  Other:  Reproducible left mid upper back pain, not midline. Notable left facial droop that does not spare eyebrows. No focal motor deficit. Speech intact. Oriented x 3  Medical Decision Making  Medically screening exam initiated at 5:24 PM.  Appropriate orders placed.  Hondo Nanda was informed that the remainder of the evaluation will be completed by another provider, this initial triage assessment does not replace that evaluation, and the importance of remaining in the ED until their evaluation is complete.  Patient is febrile with left upper back pain. Also has associated facial droop for two days. Droop seems consistent with bells palsy, but will obtain head CT. Out of any stroke window at this time. Location of back pain could be coming from lung, heart, kidney, spine, msk, etc. Broad workup initiated.    Adolphus Birchwood, [place] 03/21/22 1727

## 2022-03-21 NOTE — ED Provider Notes (Incomplete)
[facility]-EMERGENCY DEPT Provider Note   CSN: [id number] Arrival date & time: 03/21/22  1703     History {Add pertinent medical, surgical, social history, OB history to HPI:1} Chief Complaint  Patient presents with  . Facial Droop    Zachary Curtis is a 61 y.o. male.  The history is provided by the patient.  He complains of left-sided facial droop for the last 2 days.  He is also unable to close his left eye.  He denies any headache but there is some mild pain in his left ear.  He is also complaining of some soreness in the left upper back which is somewhat worse if he takes a deep breath or twists or turns.  He denies any trauma.  He is noted to have a low-grade fever here, but denies fevers at home.   Home Medications Prior to Admission medications   Medication Sig Start Date End Date Taking? Authorizing Provider  predniSONE (DELTASONE) 50 MG tablet Take 1 tablet (50 mg total) by mouth daily. 03/21/22  Yes Dione Booze, MD  valACYclovir (VALTREX) 1000 MG tablet Take 1 tablet (1,000 mg total) by mouth 3 (three) times daily. 03/21/22  Yes Dione Booze, MD  colchicine 0.6 MG tablet Take 1 tablet (0.6 mg total) by mouth 2 (two) times daily. 01/01/16   Linna Hoff, MD  OVER THE COUNTER MEDICATION Take 15 mLs by mouth daily. *Hos[place] Supplement*    [provider]  polyethylene glycol (MIRALAX / GLYCOLAX) packet Take 17 g by mouth daily. 01/11/16   Fayrene Helper, PA-C      Allergies    Patient has no known allergies.    Review of Systems   Review of Systems  All other systems reviewed and are negative.   Physical Exam Updated Vital Signs BP (!) 156/103   Pulse 78   Temp 97.9 F (36.6 C) (Oral)   Resp 18   SpO2 97%  Physical Exam Vitals and nursing note reviewed.   61 year old male, resting comfortably and in no acute distress. Vital signs are significant for initial elevated temperature and elevated blood pressure. Oxygen saturation is  97%, which is normal. Head is normocephalic and atraumatic. PERRLA, EOMI. Oropharynx is clear. Neck is nontender and supple without adenopathy or JVD. Back is nontender and there is no CVA tenderness. Lungs are clear without rales, wheezes, or rhonchi. Chest is nontender. Heart has regular rate and rhythm without murmur. Abdomen is soft, flat, nontender without masses or hepatosplenomegaly and peristalsis is normoactive. Extremities have no cyanosis or edema, full range of motion is present. Skin is warm and dry without rash. Neurologic: Mental status is normal, dense left peripheral 7th nerve palsy, remainder of cranial nerves are intact.  Strength is 5/5 in all 4 extremities.  Station and gait are normal.  ED Results / Procedures / Treatments   Labs (all labs ordered are listed, but only abnormal results are displayed) Labs Reviewed  COMPREHENSIVE METABOLIC PANEL - Abnormal; Notable for the following components:      Result Value   Sodium 134 (*)    Glucose, Bld 109 (*)    Total Bilirubin 2.2 (*)    All other components within normal limits  SARS CORONAVIRUS 2 BY RT PCR  PROTIME-INR  APTT  CBC  DIFFERENTIAL  ETHANOL  LACTIC ACID, PLASMA  LACTIC ACID, PLASMA  URINALYSIS, ROUTINE W REFLEX MICROSCOPIC  TROPONIN I (HIGH SENSITIVITY)  TROPONIN I (HIGH SENSITIVITY)    EKG ED  ECG REPORT   Date: 03/21/2022  Rate: 95  Rhythm: normal sinus rhythm  QRS Axis: normal  Intervals: normal  ST/T Wave abnormalities: normal  Conduction Disutrbances:none  Narrative Interpretation: Normal ECG.  No prior ECGs available for comparison.  Old EKG Reviewed: none available  I have personally reviewed the EKG tracing and agree with the computerized printout as noted.   Radiology CT HEAD WO CONTRAST  Result Date: 03/21/2022 CLINICAL DATA:  Left-sided facial droop x2 days. EXAM: CT HEAD WITHOUT CONTRAST TECHNIQUE: Contiguous axial images were obtained from the base of the skull through the  vertex without intravenous contrast. RADIATION DOSE REDUCTION: This exam was performed according to the departmental dose-optimization program which includes automated exposure control, adjustment of the mA and/or kV according to patient size and/or use of iterative reconstruction technique. COMPARISON:  December 20, 2005 FINDINGS: Brain: There is mild cerebral atrophy with widening of the extra-axial spaces and ventricular dilatation. There are areas of decreased attenuation within the white matter tracts of the supratentorial brain, consistent with microvascular disease changes. Vascular: No hyperdense vessel or unexpected calcification. Skull: Chronic, bilateral nondisplaced nasal bone fractures are seen. Sinuses/Orbits: No acute finding. Other: None. IMPRESSION: 1. No acute intracranial abnormality. 2. Generalized cerebral atrophy and microvascular disease changes of the supratentorial brain. 3. Chronic, bilateral nondisplaced nasal bone fractures. Electronically Signed   By: Virgina Norfolk M.D.   On: 03/21/2022 18:13    Procedures Procedures  {Document cardiac monitor, telemetry assessment procedure when appropriate:1}  Medications Ordered in ED Medications  predniSONE (DELTASONE) tablet 60 mg (has no administration in time range)    ED Course/ Medical Decision Making/ A&P                           Medical Decision Making Risk Prescription drug management.   Left-sided Bell's palsy.  No evidence of stroke.  Low-grade fever, possible viral syndrome.  Left upper back pain which seems most likely to be musculoskeletal in origin, low index of suspicion for infectious etiology.  I have reviewed and interpreted his electrocardiogram, and my interpretation is normal ECG with no prior ECG available for comparison.  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  CT of head shows evidence of old nasal fracture but no acute process.  Chest x-ray shows no evidence of pneumonia.  Have independently viewed the images, and agree with the radiologist's interpretation.  I am giving him prescriptions for a short course of prednisone as well as a 1 week course of valacyclovir.  Patient is advised to use artificial tears, tape his left eye closed when going to sleep.  Blood pressure is elevated, recommended that he recheck it as an outpatient.   Final Clinical Impression(s) / ED Diagnoses Final diagnoses:  Bell's palsy  Nonspecific chest pain  Fever in adult  Elevated blood-pressure reading without diagnosis of hypertension  Hyponatremia  Serum total bilirubin elevated    Rx / DC Orders ED Discharge Orders          Ordered    predniSONE (DELTASONE) 50 MG tablet  Daily        03/21/22 2349    valACYclovir (VALTREX) 1000 MG tablet  3 times daily        03/21/22 2349

## 2022-03-22 LAB — SARS CORONAVIRUS 2 BY RT PCR: SARS Coronavirus 2 by RT PCR: NEGATIVE
